# Patient Record
Sex: Male | Born: 1960 | Hispanic: Refuse to answer | Marital: Married | Smoking: Current every day smoker
Health system: Southern US, Community
[De-identification: ages and names within clinical notes are randomized; demographics above are authoritative.]

## PROBLEM LIST (undated history)

## (undated) DIAGNOSIS — I10 Essential (primary) hypertension: Secondary | ICD-10-CM

## (undated) DIAGNOSIS — E785 Hyperlipidemia, unspecified: Secondary | ICD-10-CM

## (undated) DIAGNOSIS — Z8669 Personal history of other diseases of the nervous system and sense organs: Secondary | ICD-10-CM

## (undated) DIAGNOSIS — T7840XA Allergy, unspecified, initial encounter: Secondary | ICD-10-CM

## (undated) HISTORY — DX: Essential (primary) hypertension: I10

## (undated) HISTORY — DX: Hyperlipidemia, unspecified: E78.5

## (undated) HISTORY — DX: Personal history of other diseases of the nervous system and sense organs: Z86.69

## (undated) HISTORY — DX: Allergy, unspecified, initial encounter: T78.40XA

## (undated) HISTORY — PX: OTHER SURGICAL HISTORY: SHX169

---

## 2011-06-24 ENCOUNTER — Other Ambulatory Visit: Payer: Self-pay | Admitting: Internal Medicine

## 2011-06-24 ENCOUNTER — Other Ambulatory Visit (INDEPENDENT_AMBULATORY_CARE_PROVIDER_SITE_OTHER): Payer: BC Managed Care – PPO

## 2011-06-24 ENCOUNTER — Ambulatory Visit (INDEPENDENT_AMBULATORY_CARE_PROVIDER_SITE_OTHER): Payer: BC Managed Care – PPO | Admitting: Internal Medicine

## 2011-06-24 ENCOUNTER — Encounter: Payer: Self-pay | Admitting: Internal Medicine

## 2011-06-24 VITALS — BP 134/74 | HR 78 | Temp 97.8°F | Wt 214.0 lb

## 2011-06-24 DIAGNOSIS — Z23 Encounter for immunization: Secondary | ICD-10-CM

## 2011-06-24 DIAGNOSIS — Z0000 Encounter for general adult medical examination without abnormal findings: Secondary | ICD-10-CM

## 2011-06-24 DIAGNOSIS — E785 Hyperlipidemia, unspecified: Secondary | ICD-10-CM

## 2011-06-24 DIAGNOSIS — I10 Essential (primary) hypertension: Secondary | ICD-10-CM

## 2011-06-24 LAB — LIPID PANEL
Cholesterol: 224 mg/dL — ABNORMAL HIGH (ref 0–200)
HDL: 45.5 mg/dL (ref >39.00)
VLDL: 15.4 mg/dL (ref 0.0–40.0)

## 2011-06-24 LAB — HEPATIC FUNCTION PANEL
ALT: 25 U/L (ref 0–53)
AST: 22 U/L (ref 0–37)
Bilirubin, Direct: 0.1 mg/dL (ref 0.0–0.3)
Total Bilirubin: 0.7 mg/dL (ref 0.3–1.2)

## 2011-06-24 LAB — COMPREHENSIVE METABOLIC PANEL
AST: 22 U/L (ref 0–37)
Alkaline Phosphatase: 70 U/L (ref 39–117)
BUN: 7 mg/dL (ref 6–23)
Creatinine, Ser: 0.7 mg/dL (ref 0.4–1.5)

## 2011-06-24 LAB — LDL CHOLESTEROL, DIRECT: Direct LDL: 162.2 mg/dL

## 2011-06-24 NOTE — Progress Notes (Signed)
Subjective:    Patient ID: Christian Norton, male    DOB: [birth year], 50 y.o.   MRN: [id number]  HPI Christian Norton presents to establish for on-going continuity care. He is due for a basic physical. Over the past year he has lost 30 lbs through diet and exercise. He has a family h/o diabetes and is concerned. He has had some increased anxiety. He finds that he has some presbyopia. He is concerned about muscle mass loss and decreased tone and whether this represents testosterone deficiency.   Past Medical History  Diagnosis Date  . Allergy     hay fever  . Hypertension     currently stable on no medications.  . Hyperlipidemia     using red yeast rice  . History of migraines     stopped 2-3 years ago. Has had full neuro eval   Past Surgical History  Procedure Date  . 5th metatarsal excised     secondary to crush injury  . Broken rib     after a fall - but not formally diagnosed.    Family History  Problem Relation Age of Onset  . Cancer Mother     breast cancer survivor  . Hyperlipidemia Mother   . Hypertension Father   . Tremor Father    History   Social History  . Marital Status: Married    Spouse Name: N/A    Number of Children: N/A  . Years of Education: N/A   Occupational History  . Not on file.   Social History Main Topics  . Smoking status: Current Everyday Smoker -- 1.0 packs/day for 19 years  . Smokeless tobacco: Not on file   Comment: brief counseling about moving from precontemplative to comtemplative.   . Alcohol Use: 1.5 - 2.0 oz/week    3-4 drink(s) per week     makes beer and wine  . Drug Use: No  . Sexually Active: Yes -- Male partner(s)   Other Topics Concern  . Not on file   Social History Narrative   HSG, Christian Norton BA biology. 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 Married '93 - 1 son ' 96. Honors student being recruited Christian Norton, Christian Norton. Work - Christian Norton. Marriage in fair health.       Review of  Systems Constitutional:  Negative for fever, chills, activity change and unexpected weight change, but intentional weight loss 30 lbs HEENT:  Negative for hearing loss but has intermittent tinnitus,no  ear pain, congestion, neck stiffness and postnasal drip. Negative for sore throat or swallowing problems. Negative for dental complaints.   Eyes: Negative for vision loss or change in visual acuity.  Respiratory: Negative for chest tightness and wheezing. Negative for DOE.   Cardiovascular: Negative for chest pain or palpitations. No decreased exercise tolerance Gastrointestinal: No change in bowel habit. No bloating or gas. No reflux or indigestion Genitourinary: Negative for urgency, frequency, flank pain and difficulty urinating. Mild ED- ? Decreased libido Musculoskeletal: Negative for myalgias, back pain, arthralgias and gait problem except for mild pain left knee intermittently.  Neurological: Negative for dizziness, tremors, weakness and headaches.  Hematological: Negative for adenopathy.  Psychiatric/Behavioral: Negative for behavioral problems and dysphoric mood.       Objective:   Physical Exam Vital signs reviewed-stable with good BP Gen'l: Well nourished well developed, trim appearing white male in no acute distress  HEENT: Head: Normocephalic and atraumatic. Right Ear: External ear normal. EAC/TM nl. Left Ear:  External ear normal.  EAC/TM nl. Nose: Nose normal. Mouth/Throat: Oropharynx is clear and moist. Dentition - native, in good repair. No buccal or palatal lesions. Posterior pharynx clear. Eyes: Conjunctivae and sclera clear. EOM intact. Pupils are equal, round, and reactive to light. Right eye exhibits no discharge. Left eye exhibits no discharge. Neck: Normal range of motion. Neck supple. No JVD present. No tracheal deviation present. No thyromegaly present.  Cardiovascular: Normal rate, regular rhythm, no gallop, no friction rub, no murmur heard.      Quiet precordium. 2+  radial and DP pulses . No carotid bruits Pulmonary/Chest: Effort normal. No respiratory distress or increased WOB, no wheezes, no rales. No chest wall deformity or CVAT. Abdominal: Soft. Bowel sounds are normal in all quadrants. He exhibits no distension, no tenderness, no rebound or guarding, No heptosplenomegaly  Genitourinary: deferred  Musculoskeletal: Normal range of motion. He exhibits no edema and no tenderness.       Small and large joints without redness, synovial thickening or deformity. Full range of motion preserved about all small, median and large joints.  Lymphadenopathy:    He has no cervical or supraclavicular adenopathy.  Neurological: He is alert and oriented to person, place, and time. CN II-XII intact. DTRs 2+ and symmetrical biceps, radial and patellar tendons. Cerebellar function normal with no tremor, rigidity, normal gait and station.  Skin: Skin is warm and dry. No rash noted. No erythema.  Psychiatric: He has a normal mood and affect. His behavior is normal. Thought content normal.   Lab Results  Component Value Date   GLUCOSE 91 06/24/2011   CHOL 224* 06/24/2011   TRIG 77.0 06/24/2011   HDL 45.50 06/24/2011   LDLDIRECT 162.2 06/24/2011   ALT 25 06/24/2011   ALT 25 06/24/2011   AST 22 06/24/2011   AST 22 06/24/2011   NA 142 06/24/2011   K 4.1 06/24/2011   CL 104 06/24/2011   CREATININE 0.7 06/24/2011   BUN 7 06/24/2011   CO2 30 06/24/2011   TSH 0.81 06/24/2011   HGBA1C 5.7 06/24/2011       Testosterone         486.01     Normal range                                  06/24/2011         Assessment & Plan:

## 2011-06-25 LAB — TESTOSTERONE, FREE, TOTAL, SHBG
Sex Hormone Binding: 57 nmol/L (ref 13–71)
Testosterone, Free: 69.3 pg/mL (ref 47.0–244.0)
Testosterone-% Free: 1.4 % — ABNORMAL LOW (ref 1.6–2.9)
Testosterone: 481.06 ng/dL (ref 250–890)

## 2011-06-26 ENCOUNTER — Encounter: Payer: Self-pay | Admitting: Internal Medicine

## 2011-06-26 DIAGNOSIS — E785 Hyperlipidemia, unspecified: Secondary | ICD-10-CM | POA: Insufficient documentation

## 2011-06-26 DIAGNOSIS — I10 Essential (primary) hypertension: Secondary | ICD-10-CM | POA: Insufficient documentation

## 2011-06-26 DIAGNOSIS — Z0000 Encounter for general adult medical examination without abnormal findings: Secondary | ICD-10-CM | POA: Insufficient documentation

## 2011-06-26 NOTE — Assessment & Plan Note (Signed)
BP 134/74 in the office today is adequate and there is not indication for medical therapy.

## 2011-06-26 NOTE — Assessment & Plan Note (Signed)
Lipid panel reveal poor control with LDL above goal of 130 or less. He has been on a weight loss diet and has made good gains here.  Plan - Patient may opt for life-style management: low fat diet and continued exercise along with continued Red Yeast Rice.  Repeat lab in 6 months           Patient if already doing maximum in regard to life-style should consider medical therapy with a "statin" drug pharmaceutical grade as opposed to RedYeast Rice

## 2011-06-26 NOTE — Assessment & Plan Note (Signed)
Interval history is remarkable for planned weight loss. Physical exam is unremarkable. Lab results except for cholesterol levels are normal. He is current with immunizations. He is not yet due for colorectal cancer screening.   Tobacco abuse - he is determined to quit smoking and is ready for active effort. He is advised to keep a "smoker's diary." When he has this done he will return for detailed smoking cessation planning.   In summary - a nice man who appears medically stable and highly motivated to improve and maintain his health. He will return as above.

## 2011-06-30 ENCOUNTER — Encounter: Payer: Self-pay | Admitting: Internal Medicine

## 2012-10-08 ENCOUNTER — Encounter: Payer: Self-pay | Admitting: Internal Medicine

## 2012-10-08 ENCOUNTER — Other Ambulatory Visit (INDEPENDENT_AMBULATORY_CARE_PROVIDER_SITE_OTHER): Payer: BC Managed Care – PPO

## 2012-10-08 ENCOUNTER — Ambulatory Visit (INDEPENDENT_AMBULATORY_CARE_PROVIDER_SITE_OTHER): Payer: BC Managed Care – PPO | Admitting: Internal Medicine

## 2012-10-08 ENCOUNTER — Ambulatory Visit (INDEPENDENT_AMBULATORY_CARE_PROVIDER_SITE_OTHER)
Admission: RE | Admit: 2012-10-08 | Discharge: 2012-10-08 | Disposition: A | Discharge status: Home / Self Care | Payer: BC Managed Care – PPO | Source: Ambulatory Visit | Attending: Internal Medicine | Admitting: Internal Medicine

## 2012-10-08 VITALS — BP 138/92 | HR 92 | Temp 97.5°F | Resp 12 | Ht 74.0 in | Wt 206.0 lb

## 2012-10-08 DIAGNOSIS — Z23 Encounter for immunization: Secondary | ICD-10-CM

## 2012-10-08 DIAGNOSIS — M51369 Other intervertebral disc degeneration, lumbar region without mention of lumbar back pain or lower extremity pain: Secondary | ICD-10-CM

## 2012-10-08 DIAGNOSIS — H9190 Unspecified hearing loss, unspecified ear: Secondary | ICD-10-CM

## 2012-10-08 DIAGNOSIS — E785 Hyperlipidemia, unspecified: Secondary | ICD-10-CM

## 2012-10-08 DIAGNOSIS — Z125 Encounter for screening for malignant neoplasm of prostate: Secondary | ICD-10-CM

## 2012-10-08 DIAGNOSIS — Z0000 Encounter for general adult medical examination without abnormal findings: Secondary | ICD-10-CM

## 2012-10-08 DIAGNOSIS — R202 Paresthesia of skin: Secondary | ICD-10-CM

## 2012-10-08 DIAGNOSIS — M5137 Other intervertebral disc degeneration, lumbosacral region: Secondary | ICD-10-CM

## 2012-10-08 DIAGNOSIS — R209 Unspecified disturbances of skin sensation: Secondary | ICD-10-CM

## 2012-10-08 DIAGNOSIS — I10 Essential (primary) hypertension: Secondary | ICD-10-CM

## 2012-10-08 DIAGNOSIS — M5136 Other intervertebral disc degeneration, lumbar region: Secondary | ICD-10-CM

## 2012-10-08 DIAGNOSIS — Z1211 Encounter for screening for malignant neoplasm of colon: Secondary | ICD-10-CM

## 2012-10-08 LAB — COMPREHENSIVE METABOLIC PANEL
ALT: 19 U/L (ref 0–53)
AST: 22 U/L (ref 0–37)
Calcium: 9.1 mg/dL (ref 8.4–10.5)
Chloride: 102 mEq/L (ref 96–112)
Creatinine, Ser: 0.8 mg/dL (ref 0.4–1.5)
Sodium: 136 mEq/L (ref 135–145)
Total Bilirubin: 0.6 mg/dL (ref 0.3–1.2)
Total Protein: 7.6 g/dL (ref 6.0–8.3)

## 2012-10-08 LAB — HEPATIC FUNCTION PANEL
Albumin: 3.9 g/dL (ref 3.5–5.2)
Alkaline Phosphatase: 61 U/L (ref 39–117)
Total Protein: 7.6 g/dL (ref 6.0–8.3)

## 2012-10-08 LAB — LIPID PANEL
Cholesterol: 222 mg/dL — ABNORMAL HIGH (ref 0–200)
Total CHOL/HDL Ratio: 6
Triglycerides: 57 mg/dL (ref 0.0–149.0)
VLDL: 11.4 mg/dL (ref 0.0–40.0)

## 2012-10-08 LAB — LDL CHOLESTEROL, DIRECT: Direct LDL: 160.3 mg/dL

## 2012-10-08 NOTE — Progress Notes (Signed)
Subjective:    Patient ID: Christian Norton, male    DOB: 1960/10/26, 52 y.o.   MRN: [id number]  HPI The patient is here for annual wellness examination and management of other chronic and acute problems.  Reviewed smoking cessation. Reviewed Vit D and the rarity of deficiency.  He has had odd feeling in the legs since July '13 with an exacerbation after lifting with a sense of poor circulation or numbness/coldness with concern for circulation.   The risk factors are reflected in the social history.  The roster of all physicians providing medical care to patient - is listed in the Snapshot section of the chart.  Activities of daily living:  The patient is 100% inedpendent in all ADLs: dressing, toileting, feeding as well as independent mobility  Home safety : The patient has smoke detectors in the home. Falls - no falls.They wear seatbelts.  firearms are present in the home, kept in a safe fashion. There is no violence in the home.   There is no risks for hepatitis, STDs or HIV. There is no   history of blood transfusion. They have no travel history to infectious disease endemic areas of the world.  The patient has seen their dentist in the last six month. They have not seen their eye doctor in the last year. They admit hearing difficulty by screening and have not had audiologic testing in the last year.    They do not  have excessive sun exposure. Discussed the need for sun protection: hats, long sleeves and use of sunscreen if there is significant sun exposure.   Diet: the importance of a healthy diet is discussed. They do have a healthy  diet.  The patient has a regular exercise program: revamping his workout: currently doing calisthenics daily, cycles, etc.  The benefits of regular aerobic exercise were discussed.  Depression screen: there are no signs or vegative symptoms of depression- irritability, change in appetite, anhedonia, sadness/tearfullness.  Cognitive assessment: the  patient manages all their financial and personal affairs and is actively engaged.   The following portions of the patient's history were reviewed and updated as appropriate: allergies, current medications, past family history, past medical history,  past surgical history, past social history  and problem list.  Past Medical History  Diagnosis Date  . Allergy     hay fever  . Hypertension     currently stable on no medications.  . Hyperlipidemia     using red yeast rice  . History of migraines     stopped 2-3 years ago. Has had full neuro eval   Past Surgical History  Procedure Date  . 5th metatarsal excised     secondary to crush injury  . Broken rib     after a fall - but not formally diagnosed.    Family History  Problem Relation Age of Onset  . Cancer Mother     breast cancer survivor  . Hyperlipidemia Mother   . Hypertension Father   . Tremor Father   . Heart disease Paternal Uncle     CAD/sudden death   History   Social History  . Marital Status: Married    Spouse Name: N/A    Number of Children: 1  . Years of Education: 16   Occupational History  .  [employer]   Social History Main Topics  . Smoking status: Current Every Day Smoker -- 1.0 packs/day for 19 years  . Smokeless tobacco: Never Used     Comment:  brief counseling about moving from precontemplative to comtemplative.   . Alcohol Use: 1.5 - 2.0 oz/week    3-4 drink(s) per week     Comment: makes beer and wine  . Drug Use: No  . Sexually Active: Yes -- Male partner(s)   Other Topics Concern  . Not on file   Social History Narrative   HSG, Dennie Maizes BA biology. 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 Married '93 - 1 son ' 96. 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 Marriage in fair health.     Current Outpatient Prescriptions on File Prior to Visit  Medication Sig Dispense Refill  . Ascorbic Acid (VITAMIN C) 100 MG  tablet Take 100 mg by mouth daily.        Marland Kitchen co-enzyme Q-10 30 MG capsule Take 30 mg by mouth 3 (three) times daily.        . fish oil-omega-3 fatty acids 1000 MG capsule Take 2 g by mouth daily.        . Red Yeast Rice 600 MG CAPS Take by mouth.           Vision, hearing, body mass index were assessed and reviewed.   During the course of the visit the patient was educated and counseled about appropriate screening and preventive services including : fall prevention , diabetes screening, nutrition counseling, colorectal cancer screening, and recommended immunizations.    Review of Systems System review is negative for any constitutional, cardiac, pulmonary, GI or neuro symptoms or complaints other than as described in the HPI.     Objective:   Physical Exam Filed Vitals:   10/08/12 0954  BP: 138/92  Pulse: 92  Temp: 97.5 F (36.4 C)  Resp: 12   Wt Readings from Last 3 Encounters:  10/08/12 206 lb (93.441 kg)  06/24/11 214 lb (97.07 kg)   Gen'l: Well nourished well developed     male in no acute distress  HEENT: Head: Normocephalic and atraumatic. Right Ear: External ear normal. EAC/TM nl. Left Ear: External ear normal.  EAC/TM nl. Nose: Nose normal. Mouth/Throat: Oropharynx is clear and moist. Dentition - native, in good repair. No buccal or palatal lesions. Posterior pharynx clear. Eyes: Conjunctivae and sclera clear. EOM intact. Pupils are equal, round, and reactive to light. Right eye exhibits no discharge. Left eye exhibits no discharge. Neck: Normal range of motion. Neck supple. No JVD present. No tracheal deviation present. No thyromegaly present.  Cardiovascular: Normal rate, regular rhythm, no gallop, no friction rub, no murmur heard.      Quiet precordium. 2+ radial and DP/PT pulses . Slow capillary refill toes. No carotid bruits Pulmonary/Chest: Effort normal. No respiratory distress or increased WOB, no wheezes, no rales. No chest wall deformity or CVAT. Abdomen: Soft.  Bowel sounds are normal in all quadrants. He exhibits no distension, no tenderness, no rebound or guarding, No heptosplenomegaly  Genitourinary:  deferred Musculoskeletal: Normal range of motion. He exhibits no edema and no tenderness.       Small and large joints without redness, synovial thickening or deformity. Full range of motion preserved about all small, median and large joints. Back - able to stand, flex at the waist, normal gait, normal step up to exam table, nl DTRs, nl sensation Lymphadenopathy:    He has no cervical or supraclavicular adenopathy.  Neurological: He is alert and oriented to person, place, and time. CN II-XII intact. DTRs 2+ and  symmetrical biceps, radial and patellar tendons. Cerebellar function normal with no tremor, rigidity, normal gait and station.  Skin: Skin is warm and dry. No rash noted. No erythema.  Psychiatric: He has a normal mood and affect. His behavior is normal. Thought content normal.   Lab Results  Component Value Date   GLUCOSE 97 10/08/2012   CHOL 222* 10/08/2012   TRIG 57.0 10/08/2012   HDL 38.70* 10/08/2012   LDLDIRECT 160.3 10/08/2012        ALT 19 10/08/2012   AST 22 10/08/2012        NA 136 10/08/2012   K 4.2 10/08/2012   CL 102 10/08/2012   CREATININE 0.8 10/08/2012   BUN 7 10/08/2012   CO2 28 10/08/2012   TSH 0.81 06/24/2011   PSA 0.56 10/08/2012   HGBA1C 5.7 06/24/2011       B12                        510 (nl)                                                            10/08/2012  Complete Lumbar Spine X-ray series: Findings: Degenerative disc disease changes at L5-S1 with disc  space narrowing, vacuum disc and osteophyte formation.  Degenerative facet disease at L4-5 and L5 S1. Normal alignment.  No fracture. SI joints are symmetric and unremarkable.  IMPRESSION:  Degenerative disc and facet disease in the lower lumbar spine. No  acute findings.         Assessment & Plan:  Hearing loss - He does report hearing loss that is not  limiting activity  Plan Referral to audiology for full testing after screening test at [facility] revealed abnormalities.

## 2012-10-08 NOTE — Patient Instructions (Addendum)
Thanks for coming in .  Will check routine labs plus B12. Will check x-ray of the spine for changes. Circulation is good.  You will receive a full report and you can check result on MyChart.

## 2012-10-10 DIAGNOSIS — M5136 Other intervertebral disc degeneration, lumbar region: Secondary | ICD-10-CM | POA: Insufficient documentation

## 2012-10-10 MED ORDER — ATORVASTATIN CALCIUM 20 MG PO TABS: 20.0000 mg | ORAL_TABLET | Freq: Every day | ORAL | Status: AC

## 2012-10-10 NOTE — Assessment & Plan Note (Signed)
Mild paresthesia distal lower extremities - cool sometimes tingly feeling: Good pulses mid-foot and ankle with small vessel constriction distally with cool distal foot and slow capillary refill; B12 level is normal; minimal change in sensation to deep vibration. X-ray reveals multi-level disk disease - most likely cause of altered sensation.  Plan Back stretching exercise - [email or web address].com as a resource  For progressive symptoms - Physical therapy/massage referral.

## 2012-10-10 NOTE — Assessment & Plan Note (Signed)
LDL cholesterol at the treatment threshold of 160+ on "nautral" statin therapy - Red Yeast Rice.  Recommend - pharmaceutical "statin" therapy - Rx for Lipitor 20 mg (atorvastatin) to pharmacy              Follow up lab one month after starting Lipitor (orders entered)

## 2012-10-10 NOTE — Assessment & Plan Note (Signed)
Interval medical history without major medical illness, surgery or injury. Minor change in sensation distal lower extremities. Physical exam is normal. Lab results are in normal range except for cholesterol levels. He is due for colorectal cancer screening and referral is placed. Immunizations - Tdap is given.  In summary - a nice man who appears to be medically stable except for mild elevation in cholesterol levels and for mild paresthesia likely related to degenerative disk disease lumbar spine. Recommendations as above. He is asked to return to lab after starting Prescription statin therapy.

## 2012-10-10 NOTE — Assessment & Plan Note (Signed)
BP Readings from Last 3 Encounters:  10/08/12 138/92  06/24/11 134/74   Blood pressure readings in the office are OK. He reports better readings at home.  No indication for medical therapy.

## 2012-10-12 ENCOUNTER — Encounter: Payer: Self-pay | Admitting: Internal Medicine

## 2012-11-12 ENCOUNTER — Encounter: Payer: Self-pay | Admitting: Internal Medicine

## 2012-11-12 ENCOUNTER — Ambulatory Visit (AMBULATORY_SURGERY_CENTER): Payer: BC Managed Care – PPO | Admitting: *Deleted

## 2012-11-12 VITALS — Ht 74.0 in | Wt 211.0 lb

## 2012-11-12 DIAGNOSIS — Z1211 Encounter for screening for malignant neoplasm of colon: Secondary | ICD-10-CM

## 2012-11-12 MED ORDER — NA SULFATE-K SULFATE-MG SULF 17.5-3.13-1.6 GM/177ML PO SOLN: 1.0000 | Freq: Once | ORAL | Status: DC

## 2012-11-12 NOTE — Progress Notes (Signed)
NO EGG OR SOY ALLERGY. EWM 

## 2012-11-26 ENCOUNTER — Encounter: Payer: Self-pay | Admitting: Internal Medicine

## 2012-11-26 ENCOUNTER — Ambulatory Visit (AMBULATORY_SURGERY_CENTER): Payer: BC Managed Care – PPO | Admitting: Internal Medicine

## 2012-11-26 ENCOUNTER — Encounter: Payer: BC Managed Care – PPO | Admitting: Internal Medicine

## 2012-11-26 VITALS — BP 140/87 | HR 86 | Temp 98.9°F | Resp 18 | Ht 74.0 in | Wt 211.0 lb

## 2012-11-26 DIAGNOSIS — D126 Benign neoplasm of colon, unspecified: Secondary | ICD-10-CM

## 2012-11-26 DIAGNOSIS — D129 Benign neoplasm of anus and anal canal: Secondary | ICD-10-CM

## 2012-11-26 DIAGNOSIS — Z1211 Encounter for screening for malignant neoplasm of colon: Secondary | ICD-10-CM

## 2012-11-26 DIAGNOSIS — D128 Benign neoplasm of rectum: Secondary | ICD-10-CM

## 2012-11-26 DIAGNOSIS — K648 Other hemorrhoids: Secondary | ICD-10-CM

## 2012-11-26 MED ORDER — SODIUM CHLORIDE 0.9 % IV SOLN: 500.0000 mL | INTRAVENOUS | Status: DC

## 2012-11-26 NOTE — Progress Notes (Signed)
Patient did not experience any of the following events: a burn prior to discharge; a fall within the facility; wrong site/side/patient/procedure/implant event; or a hospital transfer or hospital admission upon discharge from the facility. (G8907) Patient did not have preoperative order for IV antibiotic SSI prophylaxis. (G8918)  

## 2012-11-26 NOTE — Progress Notes (Signed)
Lidocaine-40mg IV prior to Propofol InductionPropofol given over incremental dosages 

## 2012-11-26 NOTE — Patient Instructions (Addendum)
I found and removed a possible rectal polyp and some internal hemorrhoids. Otherwise ok with good prep.  I will let you know pathology results and when to have another routine colonoscopy by mail.  If the hemorrhoids bother you let me know.  Thank you for choosing me and [facility].  Iva Boop, MD, FACG YOU HAD AN ENDOSCOPIC PROCEDURE TODAY AT THE [facility]: Refer to the procedure report that was given to you for any specific questions about what was found during the examination.  If the procedure report does not answer your questions, please call your gastroenterologist to clarify.  If you requested that your care partner not be given the details of your procedure findings, then the procedure report has been included in a sealed envelope for you to review at your convenience later.  YOU SHOULD EXPECT: Some feelings of bloating in the abdomen. Passage of more gas than usual.  Walking can help get rid of the air that was put into your GI tract during the procedure and reduce the bloating. If you had a lower endoscopy (such as a colonoscopy or flexible sigmoidoscopy) you may notice spotting of blood in your stool or on the toilet paper. If you underwent a bowel prep for your procedure, then you may not have a normal bowel movement for a few days.  DIET: Your first meal following the procedure should be a light meal and then it is ok to progress to your normal diet.  A half-sandwich or bowl of soup is an example of a good first meal.  Heavy or fried foods are harder to digest and may make you feel nauseous or bloated.  Likewise meals heavy in dairy and vegetables can cause extra gas to form and this can also increase the bloating.  Drink plenty of fluids but you should avoid alcoholic beverages for 24 hours.  ACTIVITY: Your care partner should take you home directly after the procedure.  You should plan to take it easy, moving slowly for the rest of the day.  You can  resume normal activity the day after the procedure however you should NOT DRIVE or use heavy machinery for 24 hours (because of the sedation medicines used during the test).    SYMPTOMS TO REPORT IMMEDIATELY: A gastroenterologist can be reached at any hour.  During normal business hours, 8:30 AM to 5:00 PM Monday through Friday, call [phone].  After hours and on weekends, please call the GI answering service at [phone] who will take a message and have the physician on call contact you.   Following lower endoscopy (colonoscopy or flexible sigmoidoscopy):  Excessive amounts of blood in the stool  Significant tenderness or worsening of abdominal pains  Swelling of the abdomen that is new, acute  Fever of 100F or higher  Following upper endoscopy (EGD)  Vomiting of blood or coffee ground material  New chest pain or pain under the shoulder blades  Painful or persistently difficult swallowing  New shortness of breath  Fever of 100F or higher  Black, tarry-looking stools  FOLLOW UP: If any biopsies were taken you will be contacted by phone or by letter within the next 1-3 weeks.  Call your gastroenterologist if you have not heard about the biopsies in 3 weeks.  Our staff will call the home number listed on your records the next business day following your procedure to check on you and address any questions or concerns that you may have at that time  regarding the information given to you following your procedure. This is a courtesy call and so if there is no answer at the home number and we have not heard from you through the emergency physician on call, we will assume that you have returned to your regular daily activities without incident.  SIGNATURES/CONFIDENTIALITY: You and/or your care partner have signed paperwork which will be entered into your electronic medical record.  These signatures attest to the fact that that the information above on your After Visit Summary has been  reviewed and is understood.  Full responsibility of the confidentiality of this discharge information lies with you and/or your care-partner.

## 2012-11-26 NOTE — Op Note (Signed)
[facility] [address] [place], [address]   COLONOSCOPY PROCEDURE REPORT  PATIENT: Christian Norton, Christian Norton  MR#: [id number] BIRTHDATE: 12/11/1960 , 51  yrs. old GENDER: Male ENDOSCOPIST: Iva Boop, MD, [facility] REFERRED WN:[id number] Esther Hardy, M.D. PROCEDURE DATE:  11/26/2012 PROCEDURE:   Colonoscopy with snare polypectomy ASA CLASS:   Class II INDICATIONS:average risk screening. MEDICATIONS: propofol (Diprivan) 400mg  IV, MAC sedation, administered by CRNA, and These medications were titrated to patient response per physician's verbal order  DESCRIPTION OF PROCEDURE:   After the risks benefits and alternatives of the procedure were thoroughly explained, informed consent was obtained.  A digital rectal exam revealed no abnormalities of the rectum, A digital rectal exam revealed no prostatic nodules, and A digital rectal exam revealed the prostate was not enlarged.   The LB [id number]  endoscope was introduced through the anus and advanced to the cecum, which was identified by both the appendix and ileocecal valve. No adverse events experienced.   The quality of the prep was Suprep good  The instrument was then slowly withdrawn as the colon was fully examined.      COLON FINDINGS: A smooth and polypoid shaped sessile polyp measuring 5 mm in size was found in the rectum and in rectum seen upon the retroflexed view.  A polypectomy was performed with a cold snare. The resection was complete and the polyp tissue was completely retrieved.   Small internal hemorrhoids were found.   The colon mucosa was otherwise normal.  Retroflexed views revealed internal hemorrhoids. The time to cecum=3 minutes 20 seconds.  Withdrawal time=14 minutes 50 seconds.  The scope was withdrawn and the procedure completed. COMPLICATIONS: There were no complications.  ENDOSCOPIC IMPRESSION: 1.   Sessile polyp measuring 5 mm in size was found in the rectum and in rectum seen upon the  retroflexed view; polypectomy was performed with a cold snare - ? polyp vs proximal edge of hemorrhoid 2.   Small internal hemorrhoids 3.   The colon mucosa was otherwise normal - good prep  RECOMMENDATIONS: Timing of repeat colonoscopy will be determined by pathology findings.   eSigned:  Iva Boop, MD, [facility] 11/26/2012 11:35 AM   cc: Jacques Navy, MD and The Patient

## 2012-11-26 NOTE — Progress Notes (Signed)
Patient stating last po intake was black coffee, "a sip", at 0945. Finished prep and last water at 0858. Informed Dr. Leone Payor and Brennan Bailey CRNA, patient cleared for sedation by Mr. Katrinka Blazing.

## 2012-11-27 ENCOUNTER — Telehealth: Payer: Self-pay | Admitting: *Deleted

## 2012-11-27 NOTE — Telephone Encounter (Signed)
  Follow up Call-  Call back number 11/26/2012  Post procedure Call Back phone  # [phone]  Permission to leave phone message Yes     Patient questions:  Do you have a fever, pain , or abdominal swelling? no Pain Score  0 *  Have you tolerated food without any problems? yes  Have you been able to return to your normal activities? yes  Do you have any questions about your discharge instructions: Diet   no Medications  no Follow up visit  no  Do you have questions or concerns about your Care? no  Actions: * If pain score is 4 or above: No action needed, pain <4.

## 2012-12-02 ENCOUNTER — Encounter: Payer: Self-pay | Admitting: Internal Medicine

## 2012-12-02 NOTE — Progress Notes (Signed)
Quick Note:  Hyperplastic rectal polyp Not an issue Routine CRCA screening ______

## 2014-11-15 IMAGING — CR DG LUMBAR SPINE COMPLETE 4+V
5 series · 5 of 5 positions shown · non-contrast
Comparison: None.

CLINICAL DATA: Low back pain.  Bilateral leg numbness.

LUMBAR SPINE - COMPLETE 4+ VIEW

[view not recorded (1 of 5)]
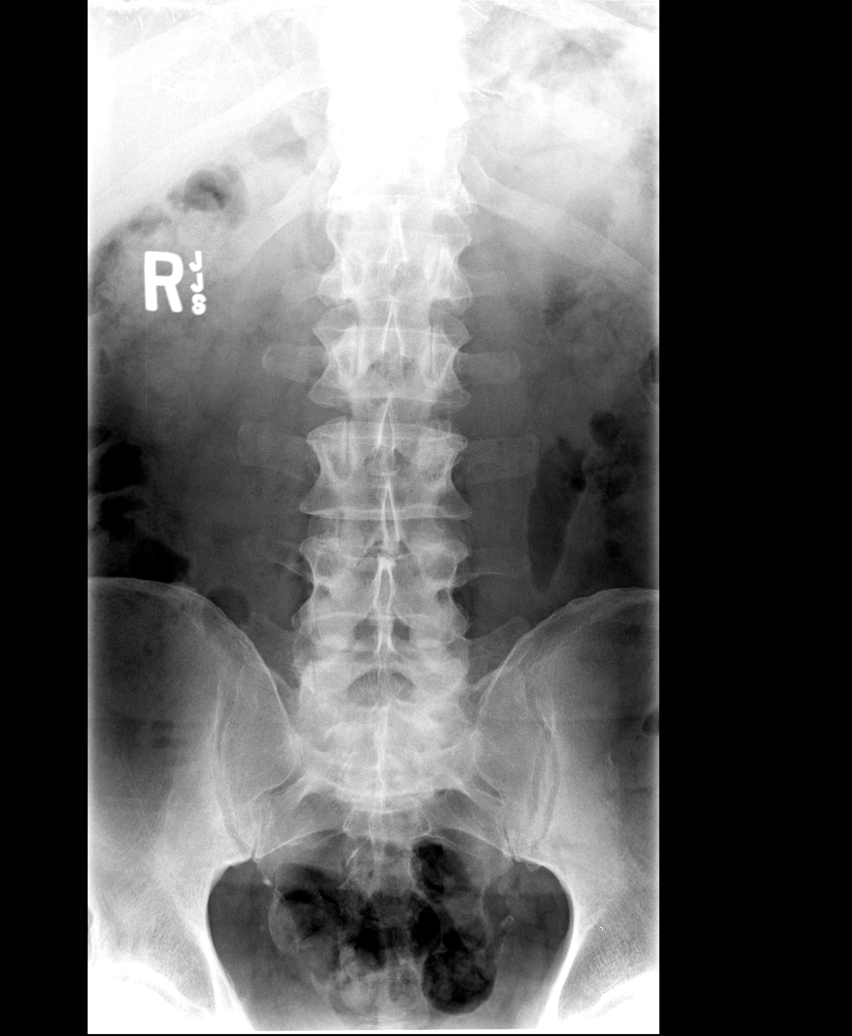

[view not recorded (2 of 5)]
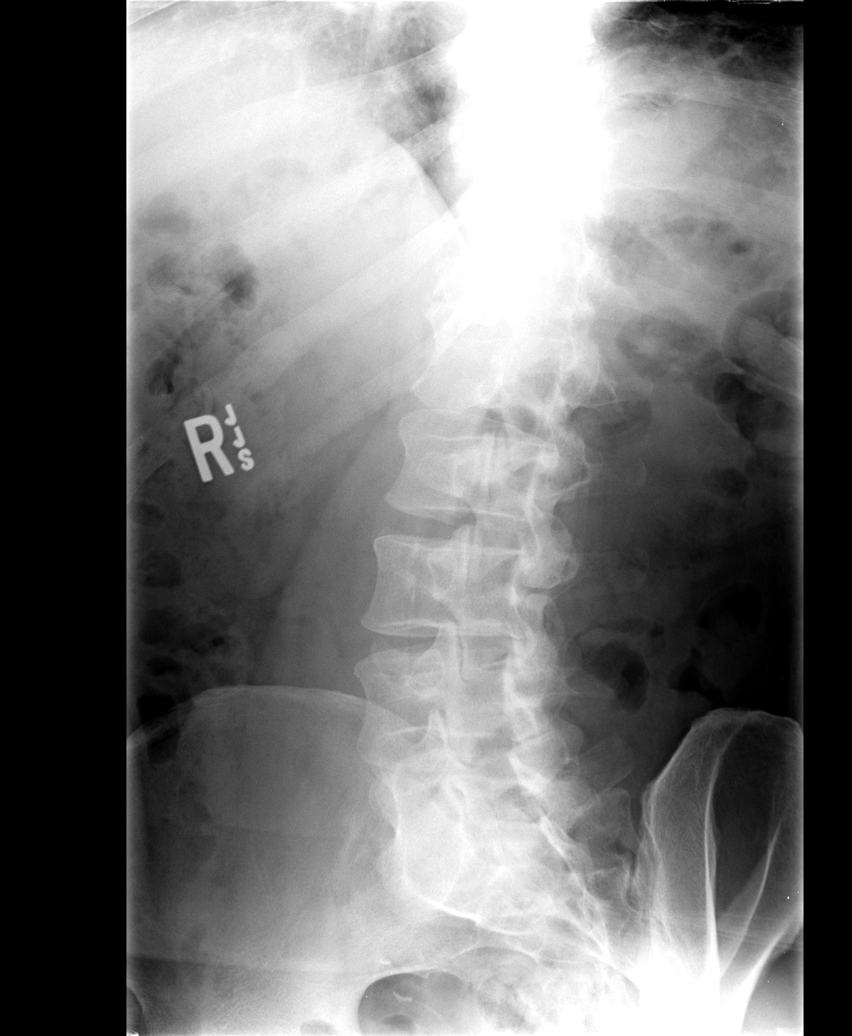

[view not recorded (3 of 5)]
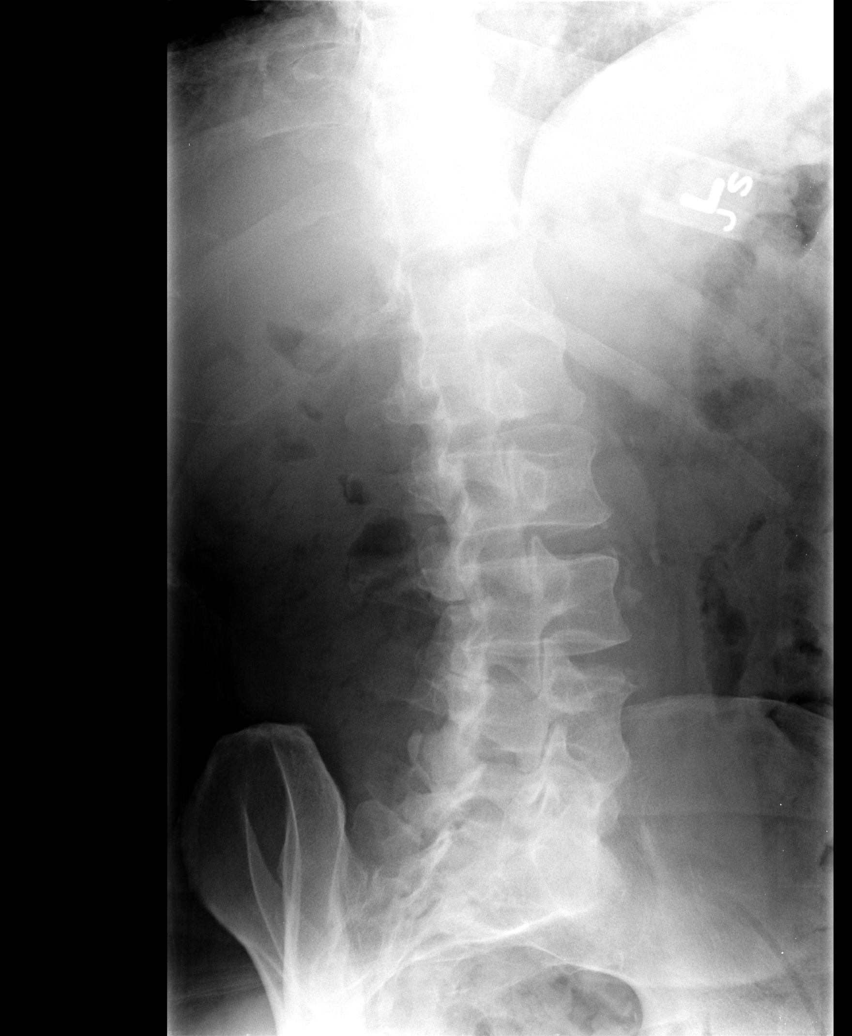

[view not recorded (4 of 5)]
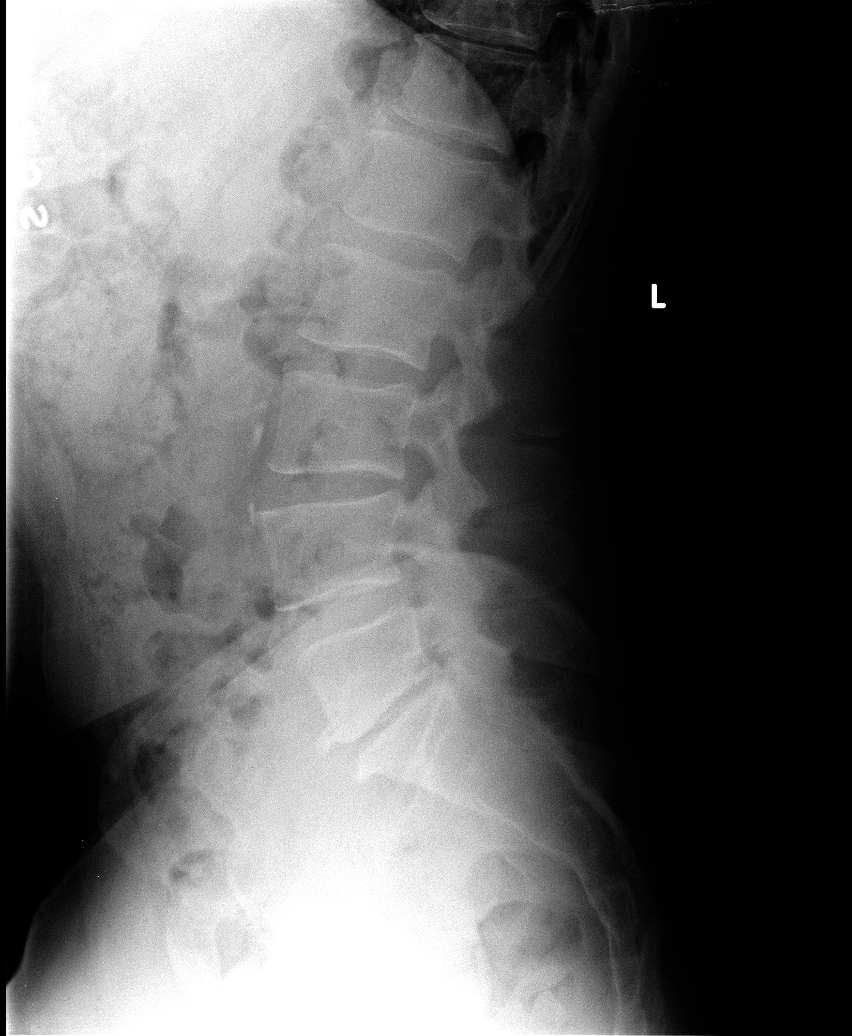

[view not recorded (5 of 5)]
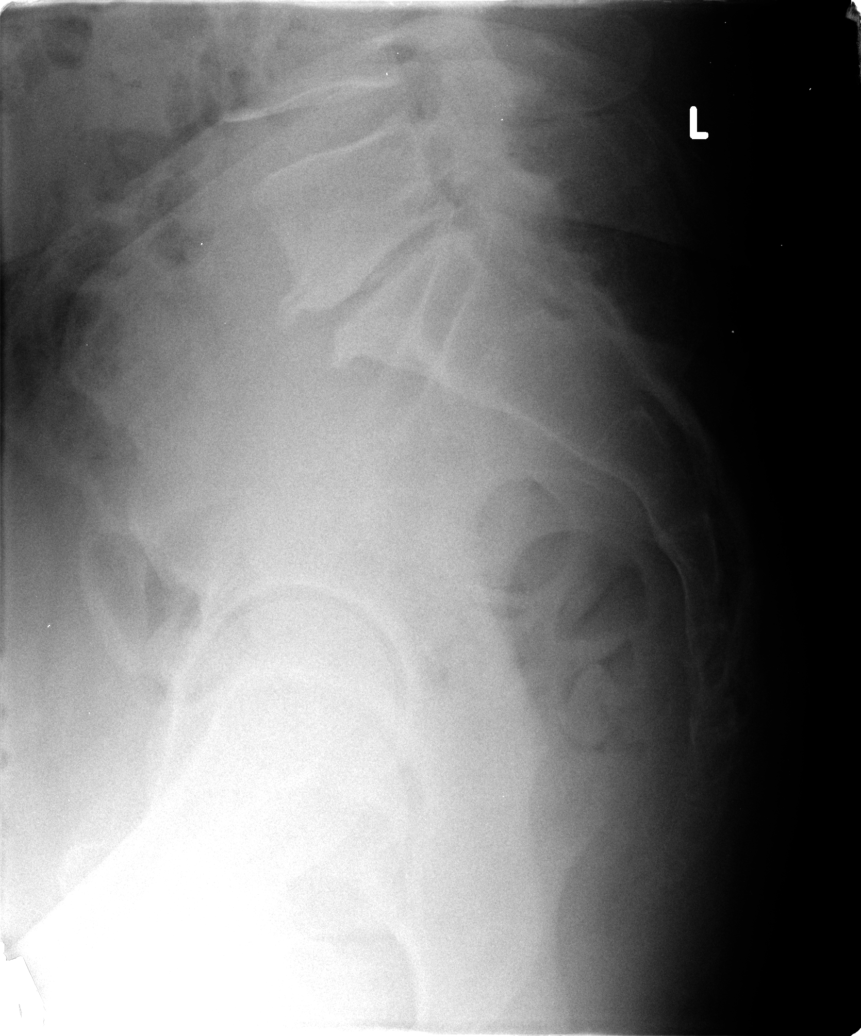

[5 of 5 positions shown; findings below may reference images not displayed]

FINDINGS: Degenerative disc disease changes at L5-S1 with disc
space narrowing, vacuum disc and osteophyte formation.
Degenerative facet disease at L4-5 and L5 S1.  Normal alignment.
No fracture.  SI joints are symmetric and unremarkable.
IMPRESSION: Degenerative disc and facet disease in the lower lumbar spine.  No
acute findings.

## 2015-02-23 ENCOUNTER — Telehealth: Payer: Self-pay | Admitting: Internal Medicine

## 2015-02-23 NOTE — Telephone Encounter (Signed)
Tried calling patient to establish care with another provider and the number on file is invalid

## 2022-12-24 ENCOUNTER — Encounter: Payer: Self-pay | Admitting: Internal Medicine
